# Patient Record
Sex: Male | Born: 2000 | Race: White | Hispanic: No | Marital: Single | Smoking: Never smoker
Health system: Southern US, Community
[De-identification: ages and names within clinical notes are randomized; demographics above are authoritative.]

## PROBLEM LIST (undated history)

## (undated) DIAGNOSIS — Z789 Other specified health status: Secondary | ICD-10-CM

## (undated) DIAGNOSIS — U071 COVID-19: Secondary | ICD-10-CM

## (undated) DIAGNOSIS — Z973 Presence of spectacles and contact lenses: Secondary | ICD-10-CM

---
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
## 2020-12-12 ENCOUNTER — Other Ambulatory Visit: Payer: Self-pay | Admitting: Urology

## 2020-12-15 ENCOUNTER — Encounter (HOSPITAL_BASED_OUTPATIENT_CLINIC_OR_DEPARTMENT_OTHER): Payer: Self-pay | Admitting: Urology

## 2020-12-15 ENCOUNTER — Other Ambulatory Visit: Payer: Self-pay

## 2020-12-15 NOTE — Progress Notes (Addendum)
Spoke w/ via phone for pre-op interview---pt Lab needs dos----    n/a           Lab results------n/a COVID test -----patient states asymptomatic no test needed Arrive at -------10am NPO after MN NO Solid Food.  Clear liquids from MN until-0900 on December 22, 2020-- Med rec completed Medications to take morning of surgery -----n/a Diabetic medication -----n/a Patient instructed no nail polish to be worn day of surgery Patient instructed to bring photo id and insurance card day of surgery Patient aware to have Driver (ride ) Steven Singh caregiver    for 24 hours after surgery  Patient Special Instructions -----n/a Pre-Op special Istructions -----n/a Patient verbalized understanding of instructions that were given at this phone interview. Patient denies shortness of breath, chest pain, fever, cough at this phone interview.

## 2020-12-21 NOTE — Anesthesia Preprocedure Evaluation (Addendum)
Anesthesia Evaluation  Patient identified by MRN, date of birth, ID band Patient awake    Reviewed: Allergy & Precautions, NPO status , Patient's Chart, lab work & pertinent test results  Airway Mallampati: II  TM Distance: >3 FB Neck ROM: Full   Comment: [place] no notable dental hx. (+) Teeth Intact, Dental Advisory Given   Pulmonary    Pulmonary exam normal breath sounds clear to auscultation       Cardiovascular Exercise Tolerance: Good Normal cardiovascular exam Rhythm:Regular Rate:Normal     Neuro/Psych negative neurological ROS  negative psych ROS   GI/Hepatic negative GI ROS, Neg liver ROS,   Endo/Other  negative endocrine ROS  Renal/GU    L testicular mass negative genitourinary   Musculoskeletal negative musculoskeletal ROS (+)   Abdominal   Peds  Hematology   Anesthesia Other Findings BMI 18.31  Reproductive/Obstetrics                            Anesthesia Physical Anesthesia Plan  ASA: 1  Anesthesia Plan: General   Post-op Pain Management:    Induction: Intravenous  PONV Risk Score and Plan: 3 and Treatment may vary due to age or medical condition, Midazolam, Ondansetron and Dexamethasone  Airway Management Planned: Oral ETT  Additional Equipment: None  Intra-op Plan:   Post-operative Plan: Extubation in OR  Informed Consent: I have reviewed the patients History and Physical, chart, labs and discussed the procedure including the risks, benefits and alternatives for the proposed anesthesia with the patient or authorized representative who has indicated his/her understanding and acceptance.     Dental advisory given  Plan Discussed with: CRNA and Anesthesiologist  Anesthesia Plan Comments: (GA w ETT)       Anesthesia Quick Evaluation

## 2020-12-22 ENCOUNTER — Ambulatory Visit (HOSPITAL_BASED_OUTPATIENT_CLINIC_OR_DEPARTMENT_OTHER): Payer: BC Managed Care – PPO | Admitting: Anesthesiology

## 2020-12-22 ENCOUNTER — Ambulatory Visit (HOSPITAL_BASED_OUTPATIENT_CLINIC_OR_DEPARTMENT_OTHER)
Admission: RE | Admit: 2020-12-22 | Discharge: 2020-12-22 | Disposition: A | Discharge status: Home / Self Care | Payer: BC Managed Care – PPO | Attending: Urology | Admitting: Urology

## 2020-12-22 ENCOUNTER — Encounter (HOSPITAL_BASED_OUTPATIENT_CLINIC_OR_DEPARTMENT_OTHER)
Admission: RE | Disposition: A | Discharge status: Home / Self Care | Payer: Self-pay | Source: Home / Self Care | Attending: Urology

## 2020-12-22 ENCOUNTER — Encounter (HOSPITAL_BASED_OUTPATIENT_CLINIC_OR_DEPARTMENT_OTHER): Payer: Self-pay | Admitting: Urology

## 2020-12-22 DIAGNOSIS — C6292 Malignant neoplasm of left testis, unspecified whether descended or undescended: Secondary | ICD-10-CM | POA: Insufficient documentation

## 2020-12-22 DIAGNOSIS — N509 Disorder of male genital organs, unspecified: Secondary | ICD-10-CM | POA: Diagnosis present

## 2020-12-22 HISTORY — DX: Presence of spectacles and contact lenses: Z97.3

## 2020-12-22 HISTORY — DX: Other specified health status: Z78.9

## 2020-12-22 HISTORY — PX: TESTICULAR PROSTHETIC INSERTION: SHX491

## 2020-12-22 HISTORY — DX: COVID-19: U07.1

## 2020-12-22 HISTORY — PX: ORCHIECTOMY: SHX2116

## 2020-12-22 SURGERY — ORCHIECTOMY
Anesthesia: General | Laterality: Left

## 2020-12-22 MED ORDER — PROPOFOL 10 MG/ML IV BOLUS
INTRAVENOUS | Status: DC | PRN
Start: 2020-12-22 — End: 2020-12-22
  Administered 2020-12-22: 170 mg via INTRAVENOUS

## 2020-12-22 MED ORDER — DEXAMETHASONE SODIUM PHOSPHATE 10 MG/ML IJ SOLN
INTRAMUSCULAR | Status: AC
  Filled 2020-12-22: qty 1

## 2020-12-22 MED ORDER — OXYCODONE HCL 5 MG/5ML PO SOLN: 5.0000 mg | Freq: Once | ORAL | Status: DC | PRN

## 2020-12-22 MED ORDER — LIDOCAINE 2% (20 MG/ML) 5 ML SYRINGE
INTRAMUSCULAR | Status: DC | PRN
  Administered 2020-12-22: 100 mg via INTRAVENOUS

## 2020-12-22 MED ORDER — SULFAMETHOXAZOLE-TRIMETHOPRIM 800-160 MG PO TABS: 1.0000 | ORAL_TABLET | Freq: Two times a day (BID) | ORAL | 0 refills | Status: DC

## 2020-12-22 MED ORDER — ROCURONIUM BROMIDE 10 MG/ML (PF) SYRINGE
PREFILLED_SYRINGE | INTRAVENOUS | Status: AC
  Filled 2020-12-22: qty 10

## 2020-12-22 MED ORDER — AMISULPRIDE (ANTIEMETIC) 5 MG/2ML IV SOLN: 10.0000 mg | Freq: Once | INTRAVENOUS | Status: DC | PRN

## 2020-12-22 MED ORDER — LACTATED RINGERS IV SOLN: INTRAVENOUS | Status: DC

## 2020-12-22 MED ORDER — ONDANSETRON HCL 4 MG/2ML IJ SOLN
INTRAMUSCULAR | Status: DC | PRN
  Administered 2020-12-22: 4 mg via INTRAVENOUS

## 2020-12-22 MED ORDER — ONDANSETRON HCL 4 MG/2ML IJ SOLN: 4.0000 mg | Freq: Once | INTRAMUSCULAR | Status: DC | PRN

## 2020-12-22 MED ORDER — HYDROCODONE-ACETAMINOPHEN 5-325 MG PO TABS: 1.0000 | ORAL_TABLET | ORAL | 0 refills | Status: DC | PRN

## 2020-12-22 MED ORDER — MIDAZOLAM HCL 5 MG/5ML IJ SOLN
INTRAMUSCULAR | Status: DC | PRN
  Administered 2020-12-22: 2 mg via INTRAVENOUS

## 2020-12-22 MED ORDER — PHENYLEPHRINE 40 MCG/ML (10ML) SYRINGE FOR IV PUSH (FOR BLOOD PRESSURE SUPPORT)
PREFILLED_SYRINGE | INTRAVENOUS | Status: DC | PRN
  Administered 2020-12-22 (×4): 80 ug via INTRAVENOUS

## 2020-12-22 MED ORDER — FENTANYL CITRATE (PF) 250 MCG/5ML IJ SOLN
INTRAMUSCULAR | Status: AC
  Filled 2020-12-22: qty 5

## 2020-12-22 MED ORDER — SUGAMMADEX SODIUM 200 MG/2ML IV SOLN
INTRAVENOUS | Status: DC | PRN
  Administered 2020-12-22: 200 mg via INTRAVENOUS

## 2020-12-22 MED ORDER — ACETAMINOPHEN 10 MG/ML IV SOLN: 1000.0000 mg | Freq: Once | INTRAVENOUS | Status: DC | PRN

## 2020-12-22 MED ORDER — PHENYLEPHRINE 40 MCG/ML (10ML) SYRINGE FOR IV PUSH (FOR BLOOD PRESSURE SUPPORT)
PREFILLED_SYRINGE | INTRAVENOUS | Status: AC
  Filled 2020-12-22: qty 10

## 2020-12-22 MED ORDER — FENTANYL CITRATE (PF) 100 MCG/2ML IJ SOLN
INTRAMUSCULAR | Status: DC | PRN
  Administered 2020-12-22: 125 ug via INTRAVENOUS

## 2020-12-22 MED ORDER — HYDROMORPHONE HCL 1 MG/ML IJ SOLN: 0.2500 mg | INTRAMUSCULAR | Status: DC | PRN

## 2020-12-22 MED ORDER — DEXAMETHASONE SODIUM PHOSPHATE 10 MG/ML IJ SOLN
INTRAMUSCULAR | Status: DC | PRN
  Administered 2020-12-22: 10 mg via INTRAVENOUS

## 2020-12-22 MED ORDER — SODIUM CHLORIDE 0.9 % IV SOLN
INTRAVENOUS | Status: AC
  Filled 2020-12-22: qty 2

## 2020-12-22 MED ORDER — PROPOFOL 10 MG/ML IV BOLUS
INTRAVENOUS | Status: AC
  Filled 2020-12-22: qty 20

## 2020-12-22 MED ORDER — SODIUM CHLORIDE 0.9% FLUSH: 3.0000 mL | Freq: Two times a day (BID) | INTRAVENOUS | Status: DC

## 2020-12-22 MED ORDER — SODIUM CHLORIDE 0.9 % IV SOLN
2.0000 g | INTRAVENOUS | Status: AC
  Administered 2020-12-22: 2 g via INTRAVENOUS

## 2020-12-22 MED ORDER — OXYCODONE HCL 5 MG PO TABS
5.0000 mg | ORAL_TABLET | Freq: Once | ORAL | Status: DC | PRN
Start: 2020-12-22 — End: 2020-12-22

## 2020-12-22 MED ORDER — ROCURONIUM BROMIDE 10 MG/ML (PF) SYRINGE
PREFILLED_SYRINGE | INTRAVENOUS | Status: DC | PRN
  Administered 2020-12-22: 40 mg via INTRAVENOUS

## 2020-12-22 MED ORDER — ONDANSETRON HCL 4 MG/2ML IJ SOLN
INTRAMUSCULAR | Status: AC
  Filled 2020-12-22: qty 2

## 2020-12-22 MED ORDER — MIDAZOLAM HCL 2 MG/2ML IJ SOLN
INTRAMUSCULAR | Status: AC
  Filled 2020-12-22: qty 2

## 2020-12-22 SURGICAL SUPPLY — 41 items
ADH SKN CLS APL DERMABOND .7 (GAUZE/BANDAGES/DRESSINGS) ×1
BLADE CLIPPER SENSICLIP SURGIC (BLADE) ×2 IMPLANT
BLADE SURG 15 STRL LF DISP TIS (BLADE) ×1 IMPLANT
BLADE SURG 15 STRL SS (BLADE) ×2
BNDG GAUZE ELAST 4 BULKY (GAUZE/BANDAGES/DRESSINGS) ×2 IMPLANT
CLOTH BEACON ORANGE TIMEOUT ST (SAFETY) ×2 IMPLANT
COVER BACK TABLE 60X90IN (DRAPES) ×2 IMPLANT
COVER MAYO STAND STRL (DRAPES) ×2 IMPLANT
DERMABOND ADVANCED (GAUZE/BANDAGES/DRESSINGS) ×1
DERMABOND ADVANCED .7 DNX12 (GAUZE/BANDAGES/DRESSINGS) ×1 IMPLANT
DRAIN PENROSE 0.25X18 (DRAIN) ×2 IMPLANT
DRAPE LAPAROTOMY 100X72 PEDS (DRAPES) ×2 IMPLANT
DRSG TEGADERM 4X4.75 (GAUZE/BANDAGES/DRESSINGS) ×2 IMPLANT
ELECT NEEDLE TIP 2.8 STRL (NEEDLE) IMPLANT
ELECT REM PT RETURN 9FT ADLT (ELECTROSURGICAL) ×2
ELECTRODE REM PT RTRN 9FT ADLT (ELECTROSURGICAL) ×1 IMPLANT
GAUZE 4X4 16PLY ~~LOC~~+RFID DBL (SPONGE) ×2 IMPLANT
GAUZE SPONGE 4X4 12PLY STRL (GAUZE/BANDAGES/DRESSINGS) ×2 IMPLANT
GLOVE SURG POLYISO LF SZ8 (GLOVE) ×2 IMPLANT
GOWN STRL REUS W/TWL LRG LVL3 (GOWN DISPOSABLE) ×4 IMPLANT
GOWN STRL REUS W/TWL XL LVL3 (GOWN DISPOSABLE) ×2 IMPLANT
KIT TURNOVER CYSTO (KITS) ×2 IMPLANT
NEEDLE HYPO 22GX1.5 SAFETY (NEEDLE) ×2 IMPLANT
PACK BASIN DAY SURGERY FS (CUSTOM PROCEDURE TRAY) ×2 IMPLANT
PENCIL SMOKE EVACUATOR (MISCELLANEOUS) ×2 IMPLANT
PROSTHESIS TESTICULAR NAC LRG (Urological Implant) ×1 IMPLANT
SET COLLECT BLD 25X3/4 12 (NEEDLE) ×2 IMPLANT
SUPPORT SCROTAL LG STRP (MISCELLANEOUS) ×2 IMPLANT
SUT CHROMIC 3 0 SH 27 (SUTURE) ×2 IMPLANT
SUT MNCRL AB 4-0 PS2 18 (SUTURE) ×2 IMPLANT
SUT PROLENE 3 0 PS 2 (SUTURE) ×2 IMPLANT
SUT VIC AB 3-0 SH 27 (SUTURE) ×2
SUT VIC AB 3-0 SH 27X BRD (SUTURE) ×1 IMPLANT
SYR 20ML LL LF (SYRINGE) ×2 IMPLANT
SYR BULB IRRIG 60ML STRL (SYRINGE) ×2 IMPLANT
SYR CONTROL 10ML LL (SYRINGE) ×2 IMPLANT
TESTICULAR PROSTHESIS NAC LRG (Urological Implant) ×2 IMPLANT
TOWEL OR 17X26 10 PK STRL BLUE (TOWEL DISPOSABLE) ×2 IMPLANT
TRAY DSU PREP LF (CUSTOM PROCEDURE TRAY) ×2 IMPLANT
TUBE CONNECTING 12X1/4 (SUCTIONS) ×2 IMPLANT
YANKAUER SUCT BULB TIP NO VENT (SUCTIONS) ×2 IMPLANT

## 2020-12-22 NOTE — Interval H&P Note (Signed)
History and Physical Interval Note:  He has elected to have a testicular prosthesis placed and this was added to his consent.   His AFP and BHCG were minimally elevated.   12/22/2020 11:51 AM  Steven Singh  has presented today for surgery, with the diagnosis of LEFT TESTICULAR MASS.  The various methods of treatment have been discussed with the patient and family. After consideration of risks, benefits and other options for treatment, the patient has consented to  Procedure(s): ORCHIECTOMY (Left) POSSIBLE INSERTION TESTICULAR PROSTHESIS (Left) as a surgical intervention.  The patient's history has been reviewed, patient examined, no change in status, stable for surgery.  I have reviewed the patient's chart and labs.  Questions were answered to the patient's satisfaction.     Irine Seal

## 2020-12-22 NOTE — Transfer of Care (Signed)
Immediate Anesthesia Transfer of Care Note  Patient: Steven Singh  Procedure(s) Performed: ORCHIECTOMY (Left) INSERTION LEFT TESTICULAR PROSTHESIS (Left)  Patient Location: PACU  Anesthesia Type:General  Level of Consciousness: drowsy, patient cooperative and responds to stimulation  Airway & Oxygen Therapy: Patient Spontanous Breathing and Patient connected to face mask oxygen  Post-op Assessment: Report given to RN and Post -op Vital signs reviewed and stable  Post vital signs: Reviewed and stable  Last Vitals:  Vitals Value Taken Time  BP 108/61 12/22/20 1304  Temp    Pulse 80 12/22/20 1307  Resp 15 12/22/20 1307  SpO2 100 % 12/22/20 1307  Vitals shown include unvalidated device data.  Last Pain:  Vitals:   12/22/20 1034  TempSrc: Oral      Patients Stated Pain Goal: 5 ([id number])  Complications: No notable events documented.

## 2020-12-22 NOTE — Anesthesia Postprocedure Evaluation (Signed)
Anesthesia Post Note  Patient: Steven Singh  Procedure(s) Performed: ORCHIECTOMY (Left) INSERTION LEFT TESTICULAR PROSTHESIS (Left)     Patient location during evaluation: PACU Anesthesia Type: General Level of consciousness: awake and alert Pain management: pain level controlled Vital Signs Assessment: post-procedure vital signs reviewed and stable Respiratory status: spontaneous breathing, nonlabored ventilation, respiratory function stable and patient connected to nasal cannula oxygen Cardiovascular status: blood pressure returned to baseline and stable Postop Assessment: no apparent nausea or vomiting Anesthetic complications: no   No notable events documented.  Last Vitals:  Vitals:   12/22/20 1355 12/22/20 1437  BP:  114/63  Pulse: 73 (!) 53  Resp: 13 14  Temp: 36.6 C 36.7 C  SpO2: 100% 100%    Last Pain:  Vitals:   12/22/20 1437  TempSrc:   PainSc: 3                  Barnet Glasgow

## 2020-12-22 NOTE — Anesthesia Procedure Notes (Signed)
Procedure Name: Intubation Date/Time: 12/22/2020 12:00 PM Performed by: Rogers Blocker, CRNA Pre-anesthesia Checklist: Patient identified, Emergency Drugs available, Suction available and Patient being monitored Patient Re-evaluated:Patient Re-evaluated prior to induction Oxygen Delivery Method: Circle System Utilized Preoxygenation: Pre-oxygenation with 100% oxygen Induction Type: IV induction Ventilation: Mask ventilation without difficulty Laryngoscope Size: Mac and 4 Grade View: Grade I Tube type: Oral Tube size: 7.0 mm Number of attempts: 1 Airway Equipment and Method: Stylet Placement Confirmation: ETT inserted through vocal cords under direct vision, positive ETCO2 and breath sounds checked- equal and bilateral Secured at: 22 cm Tube secured with: Tape Dental Injury: Teeth and Oropharynx as per pre-operative assessment

## 2020-12-22 NOTE — H&P (Signed)
CC: Testicular mass.   Hx: Steven Singh is a 20 yo male who presents for left testicular enlargement over the last month. He had a testicular injury at age 47 and he had delayed growth. He has minimal discomfort but the lesion is hard. He has no other associated signs or symptoms.     ALLERGIES: None   MEDICATIONS: None   GU PSH: No GU PSH      PSH Notes: Wisdom teeth extraction- 2014   NON-GU PSH: No Non-GU PSH    GU PMH: None   NON-GU PMH: None   FAMILY HISTORY: Diabetes - Runs in Family Heart problem - Grandfather Kidney Failure - Runs in Family Kidney Stones - Grandfather Thyroid Cancer - Grandmother   SOCIAL HISTORY: Marital Status: Single Preferred Language: English; Ethnicity: Not Hispanic Or Latino; Race: White Current Smoking Status: Patient has never smoked.   Tobacco Use Assessment Completed: Used Tobacco in last 30 days? Does not use smokeless tobacco. Drinks 1 drink per day.  Does not use drugs. Drinks 2 caffeinated drinks per day. Patient's occupation Physiological scientist.[employer]    REVIEW OF SYSTEMS:    GU Review Male:   Patient denies frequent urination, hard to postpone urination, burning/ pain with urination, get up at night to urinate, leakage of urine, stream starts and stops, trouble starting your stream, have to strain to urinate , erection problems, and penile pain.  Gastrointestinal (Upper):   Patient denies nausea, vomiting, and indigestion/ heartburn.  Gastrointestinal (Lower):   Patient denies diarrhea and constipation.  Constitutional:   Patient denies fever, night sweats, weight loss, and fatigue.  Skin:   Patient denies skin rash/ lesion and itching.  Eyes:   Patient denies blurred vision and double vision.  Ears/ Nose/ Throat:   Patient denies sore throat and sinus problems.  Hematologic/Lymphatic:   Patient denies easy bruising and swollen glands.  Cardiovascular:   Patient denies leg swelling and chest pains.  Respiratory:   Patient denies  cough and shortness of breath.  Endocrine:   Patient denies excessive thirst.  Musculoskeletal:   Patient denies back pain and joint pain.  Neurological:   Patient denies headaches and dizziness.  Psychologic:   Patient denies depression and anxiety.   VITAL SIGNS:      12/10/2020 10:49 AM  Weight 140 lb / 63.5 kg  Height 72 in / 182.88 cm  BP 120/71 mmHg  Pulse 93 /min  Temperature 97.7 F / 36.5 C  BMI 19.0 kg/m   GU PHYSICAL EXAMINATION:    Scrotum: No lesions. No edema. No cysts. No warts.  Epididymides: Right: no spermatocele, no masses, no cysts, no tenderness, no induration, no enlargement. Left: no spermatocele, no masses, no cysts, no tenderness, no induration, no enlargement.  Testes: Solid mass left testis about 3cm. No tenderness, no swelling, no enlargement left testis. No tenderness, no swelling, no enlargement right testis. Normal location left testis. Normal location right testis. No cyst, no varicocele, no hydrocele left testis. No mass, no cyst, no varicocele, no hydrocele right testis.   Urethral Meatus: Normal size. No lesion, no wart, no discharge, no polyp. Normal location.  Penis: Circumcised, no warts, no cracks. No dorsal Peyronie's plaques, no left corporal Peyronie's plaques, no right corporal Peyronie's plaques, no scarring, no warts. No balanitis, no meatal stenosis.   MULTI-SYSTEM PHYSICAL EXAMINATION:    Constitutional: Well-nourished. No physical deformities. Normally developed. Good grooming.  Neck: Neck symmetrical, not swollen. Normal tracheal position.  Respiratory: Normal breath sounds. No  labored breathing, no use of accessory muscles.   Cardiovascular: Regular rate and rhythm. No murmur, no gallop.   Lymphatic: No enlargement of neck, axillae, groin.  Skin: No paleness, no jaundice, no cyanosis. No lesion, no ulcer, no rash.  Neurologic / Psychiatric: Oriented to time, oriented to place, oriented to person. No depression, no anxiety, no agitation.   Gastrointestinal: No hernia. No mass, no tenderness, no rigidity, non obese abdomen.   Musculoskeletal: Normal gait and station of head and neck.     Complexity of Data:  Urine Test Review:   Urinalysis  X-Ray Review: Scrotal Ultrasound: Reviewed Films. Discussed With Patient.     PROCEDURES:         Scrotal Ultrasound - [id number]  Right Testicle: Length:4.7 cm  Height: 2.0 cm  Width: 2.7 cm  Left Testicle: Length: 4.6 cm  Height:2.6 cm  Width: 3.6 cm  Scrotal Wall:  WNL  Left Testis/Epididymis:  There is a large complex solid appearing mass in the mid Lt testicle. This measures 2.9x2.5x3.1 cm. This mass contains both complex cystic and solid components. There is internal vascularity noted. 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  Right Testis/Epididymis:  Appears wnl      . Patient confirmed No Neulasta OnPro Device.           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 I will get markers and get him set up for a left radical orchiectomy. I reviewed the risks of bleeding, infection, wound complications, nerve injury, pain, need for secondary therapy, endocrine and fertility issues, thrombotic events and anesthetic complications. I discussed a testicular prosthesis but he is not interested at this time.    PLAN:           Orders Labs Beta HCG, LDH, Alpha Fetoprotein (AFP)  X-Rays: Scrotal Ultrasound          Schedule Return Visit/Planned Activity: ASAP - Schedule Surgery  Procedure: Unspecified Date - Radical Orchiectomy  - [id number], left Notes: asap

## 2020-12-22 NOTE — Op Note (Signed)
Procedure: Left radical orchiectomy and placement of testicular prosthesis.  Preop diagnosis left testicular mass.  Postoperative diagnosis: Same.  Surgeon: Dr. Irine Seal.  Anesthesia: General.  Specimen: Left testicle and cord.  Drains: None.  Prosthesis: Large left testicular prosthesis.  EBL: 3 mL.  Complications: None.  Indications: Steven Singh is a 20 year old male who presented with a 1 month history of a left testicular mass which on physical exam and ultrasound was consistent with a testicular neoplasm.  Preoperative tumor markers demonstrate very mild elevation of alpha-fetoprotein and beta-hCG.  He is to undergo a left radical orchiectomy and placement of the testicular prosthesis.  Procedure: He was taken operating room where general anesthetic was induced.  PAS hose were placed.  He was given 2 g of Ancef.  He was left supine and his genitalia was clipped.  He was then prepped with a 5-minute Betadine scrub followed by Betadine solution and then draped in the usual sterile fashion.  A left inguinal oblique skin incision was made along the skin lines approximately 4 to 5 cm in length with a knife.  The incision was then opened further using the Bovie down through Scarpa's fascia.  The external bleak fascia was exposed and then incised along the length of its fibers down to the external ring.  Ilioinguinal nerve was identified and protected.  The left spermatic cord was then dissected out and once freed up sufficiently encircled with 1/4 inch Penrose drain and an occlusive fashion.  The testicle was then delivered into the wound and the gubernacular attachments were taken down.  Prior to final detachment a 3-0 Prolene suture was placed to aid placement of the prosthesis.  The cord was then dissected back to the internal ring and divided into 2 packets.  Specimen was removed and the vascular pedicles were doubly ligated with 0 Vicryl ties which were clipped long for future  identification if needed.  A large testicular prosthesis was then prepared.  It was filled with approximately 60 mL of sterile saline and the preplaced suture was then placed through the anchoring loop and tied down.  Once position was confirmed to be satisfactory the suture was trimmed.  The wound was then irrigated with saline and final hemostasis was achieved.  The external oblique fascia was then closed with a running 3-0 Vicryl with great care being taken to avoid entrapment of the ileal inguinal nerve.  The subcutaneous tissue was then reapproximated with 3-0 chromic sutures and the skin with a running intracuticular 4-0 Monocryl.  The wound was then reinforced with Dermabond.  The testicular prosthesis was then pulled down to ensure proper placement and then fluffed Curlex and a scrotal support was placed.  His anesthetic was reversed and he was moved recovery room in stable condition.  There were no complications.  The specimen was sent to pathology.

## 2020-12-22 NOTE — Discharge Instructions (Addendum)

## 2020-12-23 ENCOUNTER — Encounter (HOSPITAL_BASED_OUTPATIENT_CLINIC_OR_DEPARTMENT_OTHER): Payer: Self-pay | Admitting: Urology

## 2020-12-25 LAB — SURGICAL PATHOLOGY

## 2020-12-29 ENCOUNTER — Ambulatory Visit (HOSPITAL_COMMUNITY)
Admission: RE | Admit: 2020-12-29 | Discharge: 2020-12-29 | Disposition: A | Discharge status: Home / Self Care | Payer: BC Managed Care – PPO | Source: Ambulatory Visit | Attending: Urology | Admitting: Urology

## 2020-12-29 ENCOUNTER — Other Ambulatory Visit (HOSPITAL_COMMUNITY): Payer: Self-pay | Admitting: Urology

## 2020-12-29 ENCOUNTER — Telehealth: Payer: Self-pay | Admitting: Oncology

## 2020-12-29 ENCOUNTER — Other Ambulatory Visit: Payer: Self-pay

## 2020-12-29 DIAGNOSIS — C6292 Malignant neoplasm of left testis, unspecified whether descended or undescended: Secondary | ICD-10-CM

## 2020-12-29 NOTE — Telephone Encounter (Signed)
Received a new patient referral from Dr. Jeffie Pollock for T1 mixed germ cell tumor. Mr. Steven Singh has been cld and scheduled to see Dr. Alen Blew on 8/17 at 11am. Pt aware to arrive 20 minutes early.

## 2021-01-14 ENCOUNTER — Inpatient Hospital Stay: Payer: BC Managed Care – PPO | Attending: Oncology | Admitting: Oncology

## 2021-01-14 ENCOUNTER — Other Ambulatory Visit: Payer: Self-pay

## 2021-01-14 VITALS — BP 123/61 | HR 87 | Temp 98.3°F | Resp 18 | Ht 72.0 in | Wt 143.8 lb

## 2021-01-14 DIAGNOSIS — Z8616 Personal history of COVID-19: Secondary | ICD-10-CM

## 2021-01-14 DIAGNOSIS — Z87891 Personal history of nicotine dependence: Secondary | ICD-10-CM | POA: Diagnosis not present

## 2021-01-14 DIAGNOSIS — C6292 Malignant neoplasm of left testis, unspecified whether descended or undescended: Secondary | ICD-10-CM

## 2021-01-14 DIAGNOSIS — C6291 Malignant neoplasm of right testis, unspecified whether descended or undescended: Secondary | ICD-10-CM | POA: Diagnosis not present

## 2021-01-14 DIAGNOSIS — C6290 Malignant neoplasm of unspecified testis, unspecified whether descended or undescended: Secondary | ICD-10-CM | POA: Insufficient documentation

## 2021-01-14 NOTE — Progress Notes (Signed)
Reason for the request:    Testicular cancer  HPI: I was asked by Dr. Jeffie Singh to evaluate Steven Singh for the evaluation of testicular cancer.  This is a 20 year old rather healthy man presented with left testicular mass in July 2022.  He was evaluated by Dr. Jeffie Singh and testicular ultrasound showed the large complex mass of the left testicle measuring 2.9 x 2.5 x 3.1 cm.  Tumor markers obtained at that time and showed an elevated alpha-fetoprotein of 19.4, beta-hCG of and LDH.  and normal LDH.  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  His embryonal component is 45% with yolk sac of 10%, teratoma 40% and seminoma 5%.  Staging scans including CT scan abdomen and pelvis and chest x-ray did not show any evidence of metastatic disease.  Repeat tumor markers on January 03, 2021 showed normal alpha-fetoprotein beta-hCG and LDH.  Clinically, he reports no complaints at this time.  He has recovered from surgery well without any issues.  He does not report any headaches, blurry vision, syncope or seizures. Does not report any fevers, chills or sweats.  Does not report any cough, wheezing or hemoptysis.  Does not report any chest pain, palpitation, orthopnea or leg edema.  Does not report any nausea, vomiting or abdominal pain.  Does not report any constipation or diarrhea.  Does not report any skeletal complaints.    Does not report frequency, urgency or hematuria.  Does not report any skin rashes or lesions. Does not report any heat or cold intolerance.  Does not report any lymphadenopathy or petechiae.  Does not report any anxiety or depression.  Remaining review of systems is negative.     Past Medical History:  Diagnosis Date   COVID    had in JULY 2020, runny nose for@ a week. lost taste and smell for @ 1 month   Medical history non-contributory    Wears glasses    wears glasses and contacts. December 15, 2020  :   Past Surgical History:   Procedure Laterality Date   ORCHIECTOMY Left 12/22/2020   Procedure: ORCHIECTOMY;  Surgeon: Irine Seal, MD;  Location: [facility];  Service: Urology;  Laterality: Left;   TESTICULAR PROSTHETIC INSERTION Left 12/22/2020   Procedure: INSERTION LEFT TESTICULAR PROSTHESIS;  Surgeon: Irine Seal, MD;  Location: [facility];  Service: Urology;  Laterality: Left;   WISDOM TOOTH EXTRACTION     removed in 2019 july 18, 22  :   Current Outpatient Medications:    HYDROcodone-acetaminophen (NORCO/VICODIN) 5-325 MG tablet, Take 1 tablet by mouth every 4 (four) hours as needed for moderate pain., Disp: 8 tablet, Rfl: 0   sulfamethoxazole-trimethoprim (BACTRIM DS) 800-160 MG tablet, Take 1 tablet by mouth 2 (two) times daily., Disp: 6 tablet, Rfl: 0:   Allergies  Allergen Reactions   Other     Bananas mild itch in back of throat  :  No family history on file.:   Social History   Socioeconomic History   Marital status: Single    Spouse name: Not on file   Number of children: Not on file   Years of education: Not on file   Highest education level: Not on file  Occupational History   Not on file  Tobacco Use   Smoking status: Never   Smokeless tobacco: Never  Vaping Use   Vaping Use: Former   Start date: 06/01/2015   Quit  date: 09/17/2020  Substance and Sexual Activity   Alcohol use: Yes    Alcohol/week: 3.0 standard drinks    Types: 3 Cans of beer per week   Drug use: Never   Sexual activity: Not on file  Other Topics Concern   Not on file  Social History Narrative   Not on file   Social Determinants of Health   Financial Resource Strain: Not on file  Food Insecurity: Not on file  Transportation Needs: Not on file  Physical Activity: Not on file  Stress: Not on file  Social Connections: Not on file  Intimate Partner Violence: Not on file  :  Pertinent items are noted in HPI.  Exam: Blood pressure 123/61, pulse 87, temperature 98.3 F (36.8  C), temperature source Oral, resp. rate 18, height 6' (1.829 m), weight 143 lb 12.8 oz (65.2 kg), SpO2 100 %. ECOG 0 General appearance: alert and cooperative appeared without distress. Head: atraumatic without any abnormalities. Eyes: conjunctivae/corneas clear. PERRL.  Sclera anicteric. Throat: lips, mucosa, and tongue normal; without oral thrush or ulcers. Resp: clear to auscultation bilaterally without rhonchi, wheezes or dullness to percussion. Cardio: regular rate and rhythm, S1, S2 normal, no murmur, click, rub or gallop GI: soft, non-tender; bowel sounds normal; no masses,  no organomegaly Skin: Skin color, texture, turgor normal. No rashes or lesions Lymph nodes: Cervical, supraclavicular, and axillary nodes normal. Neurologic: Grossly normal without any motor, sensory or deep tendon reflexes. Musculoskeletal: No joint deformity or effusion.   DG Chest 2 View  Result Date: 12/29/2020 CLINICAL DATA:  Testicular neoplasm. EXAM: CHEST - 2 VIEW COMPARISON:  None. FINDINGS: The heart size and mediastinal contours are within normal limits. There is no evidence of pulmonary edema, consolidation, pneumothorax, nodule or pleural fluid. The visualized skeletal structures are unremarkable. IMPRESSION: No active cardiopulmonary disease.  No visualized pulmonary nodules. Electronically Signed   By: Aletta Edouard M.D.   On: 12/29/2020 16:08    Assessment and Plan:    20 year old with:  1.  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  He presented with left testicular mass and found to have 45% embryonal component with 10% yolk sac, 40% teratoma 5% seminoma.   The natural course of this disease was reviewed at this time and treatment choices were discussed.  Despite the 45% embryonal component, he still has low risk stage I disease.  I estimated the risk of relapse at this time after orchiectomy of at most of 20%.  He understands the majority of patients are cured  with orchiectomy along and this disease is treatable with survival rate close to 100% with salvage therapy if he relapses.  Treatment options however were discussed including active surveillance versus 1-2 cycles of BEP chemotherapy versus RPLND surgery.  The risks and benefits of all these approaches were discussed at this time. Given his low risk disease I have recommended active surveillance which he is in favor of.  Complication associated with chemotherapy include nausea, vomiting, myelosuppression and fertility issues.  Complication associated with surgery and include retrograde ejaculation as well as postoperative thrombosis, infection among others were reviewed.  Infertility issues was also emphasized after surgery.   He appears reliable and willing to continue with active surveillance.  We have discussed different strategies and protocols for active surveillance.  Physical examination, tumor markers and chest x-ray every 3 months with imaging studies of the abdomen and pelvis every 6 months for the first 2 years is recommended.  He is  agreeable to proceed with this approach.  He is scheduled for follow-up and surveillance under the care of Dr. Jeffie Singh which she prefers to do.  I am happy to address this further in the future or if he develops any relapse.  2.  Follow-up: I am happy to see him in the future as needed.   60  minutes were dedicated to this visit. The time was spent on reviewing laboratory data, imaging studies, discussing treatment options, di and answering questions regarding future plan.    A copy of this consult has been forwarded to the requesting physician.

## 2021-03-12 ENCOUNTER — Other Ambulatory Visit: Payer: Self-pay

## 2021-03-12 ENCOUNTER — Ambulatory Visit (HOSPITAL_COMMUNITY)
Admission: RE | Admit: 2021-03-12 | Discharge: 2021-03-12 | Disposition: A | Discharge status: Home / Self Care | Payer: BC Managed Care – PPO | Source: Ambulatory Visit | Attending: Urology | Admitting: Urology

## 2021-03-12 ENCOUNTER — Other Ambulatory Visit (HOSPITAL_COMMUNITY): Payer: Self-pay | Admitting: Urology

## 2021-03-12 DIAGNOSIS — C6290 Malignant neoplasm of unspecified testis, unspecified whether descended or undescended: Secondary | ICD-10-CM

## 2021-07-06 ENCOUNTER — Other Ambulatory Visit (HOSPITAL_COMMUNITY): Payer: Self-pay | Admitting: Urology

## 2021-07-06 ENCOUNTER — Other Ambulatory Visit: Payer: Self-pay

## 2021-07-06 ENCOUNTER — Ambulatory Visit (HOSPITAL_COMMUNITY)
Admission: RE | Admit: 2021-07-06 | Discharge: 2021-07-06 | Disposition: A | Discharge status: Home / Self Care | Payer: BC Managed Care – PPO | Source: Ambulatory Visit | Attending: Urology | Admitting: Urology

## 2021-07-06 DIAGNOSIS — Z8547 Personal history of malignant neoplasm of testis: Secondary | ICD-10-CM

## 2021-10-06 ENCOUNTER — Other Ambulatory Visit (HOSPITAL_COMMUNITY): Payer: Self-pay | Admitting: Urology

## 2021-10-06 ENCOUNTER — Ambulatory Visit (HOSPITAL_COMMUNITY)
Admission: RE | Admit: 2021-10-06 | Discharge: 2021-10-06 | Disposition: A | Discharge status: Home / Self Care | Payer: BC Managed Care – PPO | Source: Ambulatory Visit | Attending: Urology | Admitting: Urology

## 2021-10-06 DIAGNOSIS — Z8547 Personal history of malignant neoplasm of testis: Secondary | ICD-10-CM | POA: Insufficient documentation

## 2021-12-28 ENCOUNTER — Other Ambulatory Visit (HOSPITAL_COMMUNITY): Payer: Self-pay | Admitting: Urology

## 2021-12-28 ENCOUNTER — Ambulatory Visit (HOSPITAL_COMMUNITY)
Admission: RE | Admit: 2021-12-28 | Discharge: 2021-12-28 | Disposition: A | Discharge status: Home / Self Care | Payer: BC Managed Care – PPO | Source: Ambulatory Visit | Attending: Urology | Admitting: Urology

## 2021-12-28 DIAGNOSIS — Z8547 Personal history of malignant neoplasm of testis: Secondary | ICD-10-CM | POA: Diagnosis present

## 2022-04-26 ENCOUNTER — Other Ambulatory Visit (HOSPITAL_COMMUNITY): Payer: Self-pay | Admitting: Urology

## 2022-04-26 ENCOUNTER — Ambulatory Visit (HOSPITAL_COMMUNITY)
Admission: RE | Admit: 2022-04-26 | Discharge: 2022-04-26 | Disposition: A | Discharge status: Home / Self Care | Payer: BC Managed Care – PPO | Source: Ambulatory Visit | Attending: Urology | Admitting: Urology

## 2022-04-26 DIAGNOSIS — Z8547 Personal history of malignant neoplasm of testis: Secondary | ICD-10-CM

## 2022-05-03 DIAGNOSIS — Z8547 Personal history of malignant neoplasm of testis: Secondary | ICD-10-CM | POA: Diagnosis not present

## 2022-07-08 ENCOUNTER — Telehealth: Payer: Self-pay

## 2022-07-08 NOTE — Telephone Encounter (Signed)
Mychart msg sent. AS, CMA 

## 2022-07-26 IMAGING — DX DG CHEST 2V
2 series · 2 of 2 positions shown · non-contrast
Comparison: Chest x-ray 12/29/2020.

CLINICAL DATA: 19-year-old male with history of testicular
neoplasm.

EXAM:
CHEST - 2 VIEW

[chest pa]
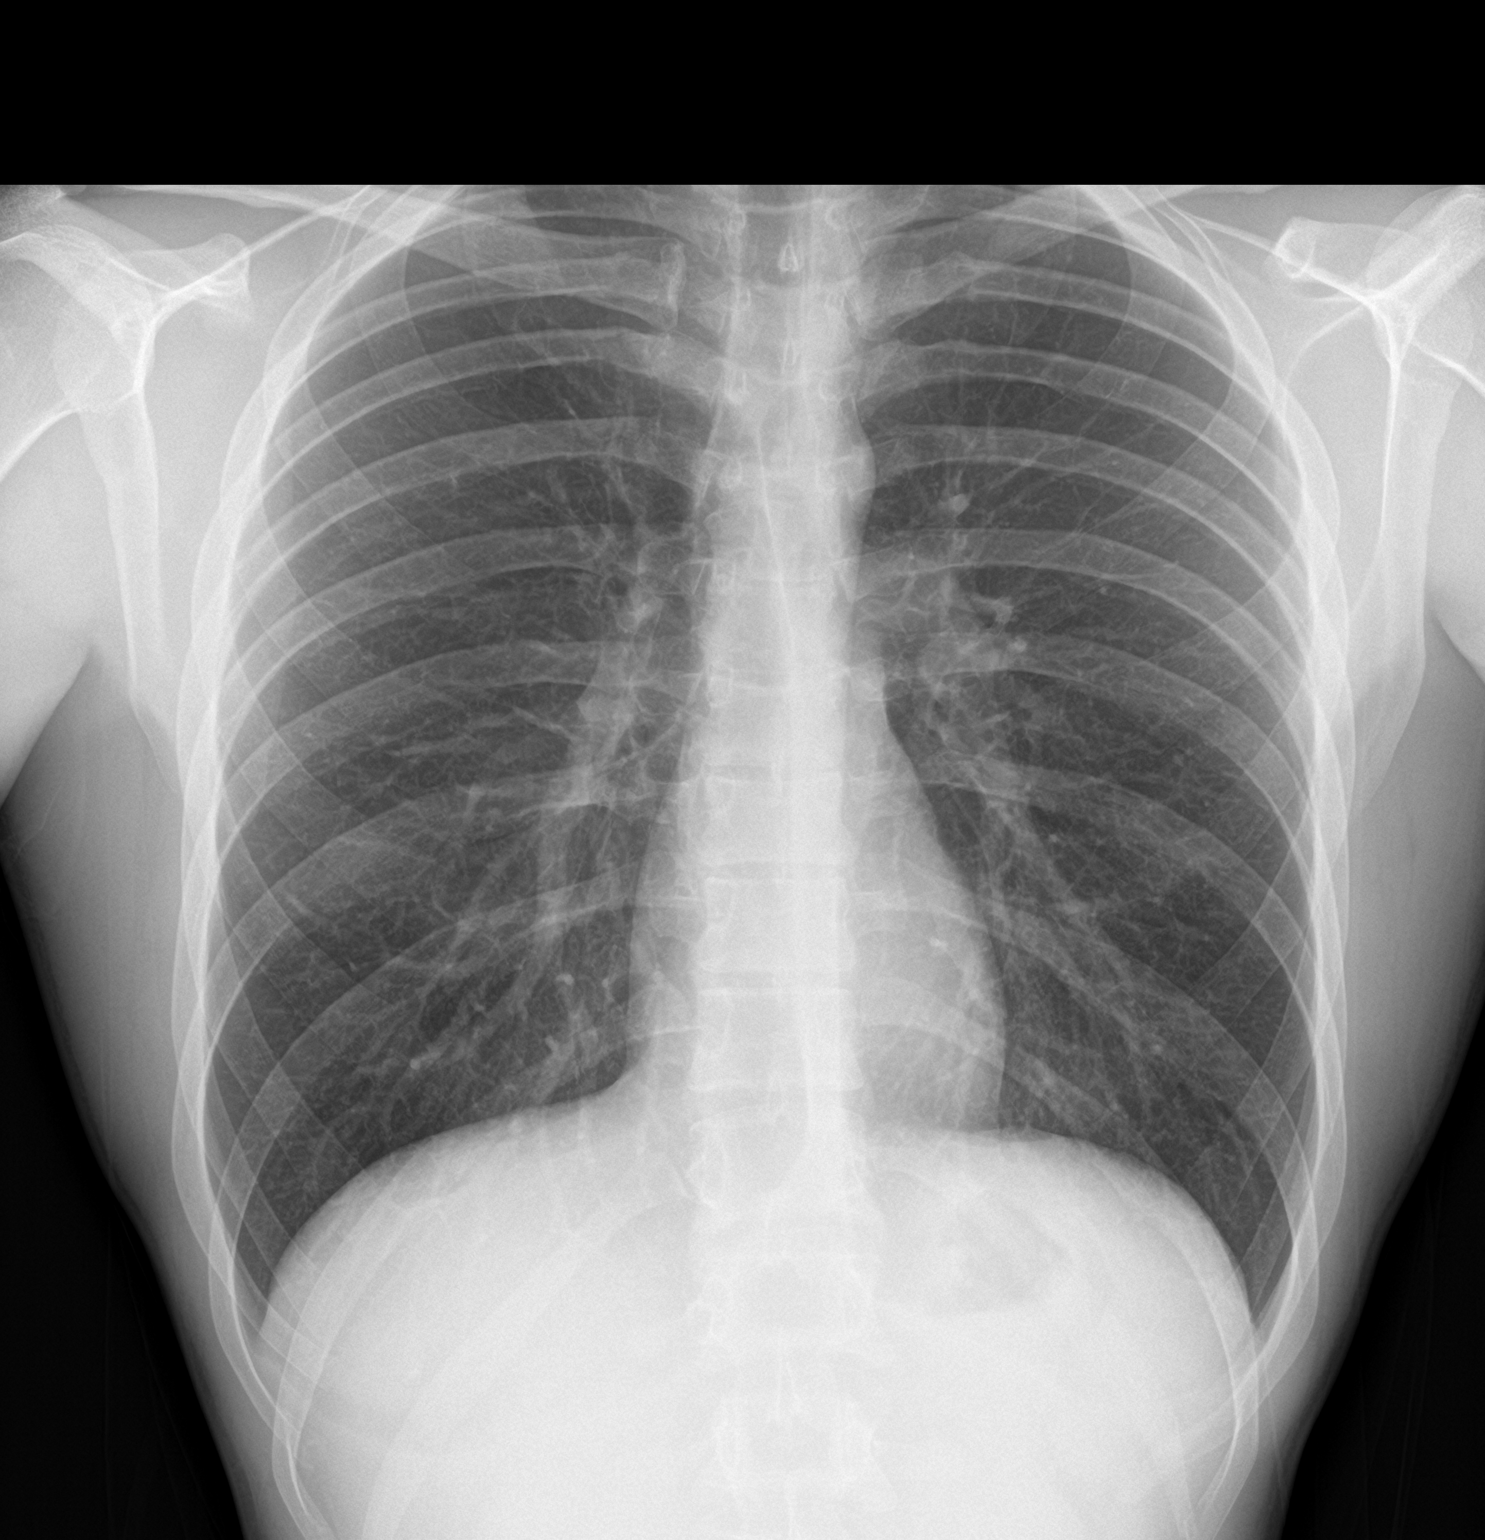

[chest lat]
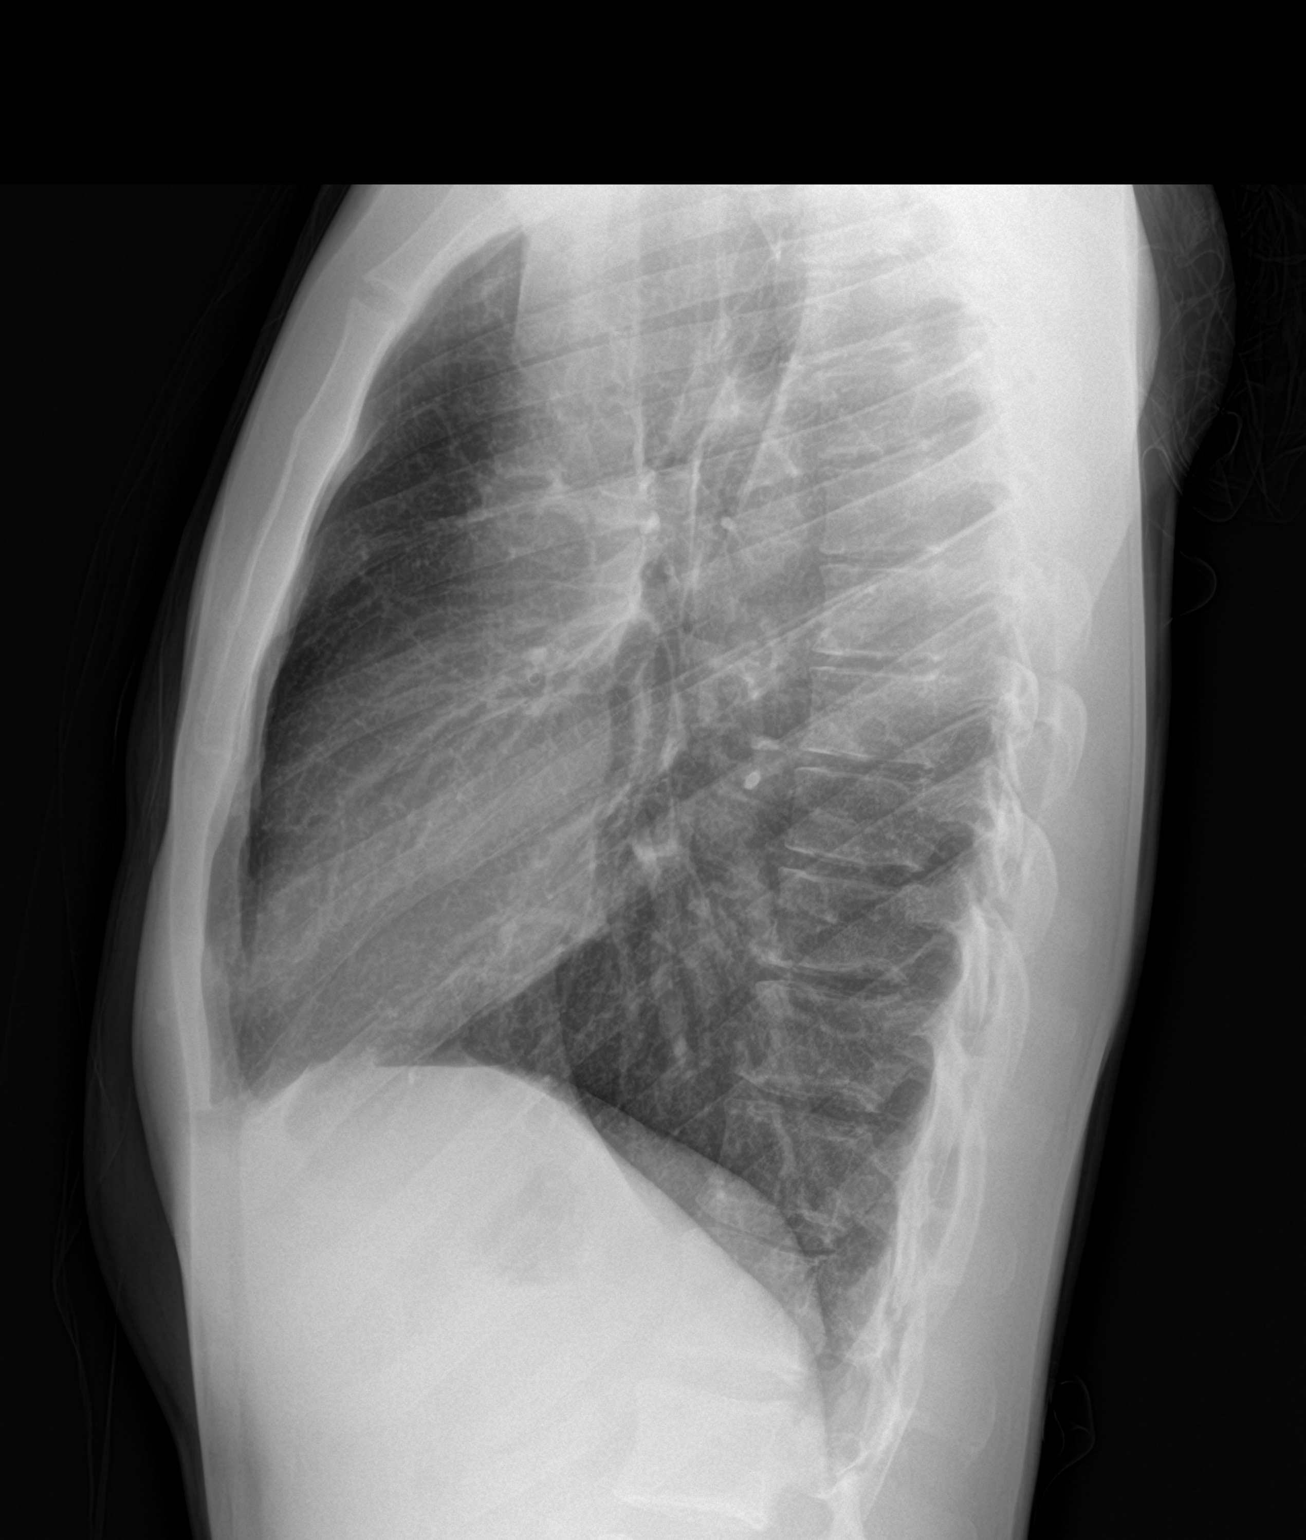

[2 of 2 positions shown; findings below may reference images not displayed]

FINDINGS: Lung volumes are normal. No consolidative airspace disease. No
pleural effusions. No pneumothorax. No pulmonary nodule or mass
noted. Pulmonary vasculature and the cardiomediastinal silhouette
are within normal limits.
IMPRESSION: No radiographic evidence of acute cardiopulmonary disease.

## 2022-08-23 ENCOUNTER — Ambulatory Visit (HOSPITAL_COMMUNITY): Admission: RE | Admit: 2022-08-23 | Payer: Medicaid Other | Source: Ambulatory Visit

## 2022-08-23 ENCOUNTER — Encounter (HOSPITAL_COMMUNITY): Payer: Self-pay

## 2022-08-23 ENCOUNTER — Ambulatory Visit (HOSPITAL_COMMUNITY)
Admission: RE | Admit: 2022-08-23 | Discharge: 2022-08-23 | Disposition: A | Discharge status: Home / Self Care | Payer: Medicaid Other | Source: Ambulatory Visit | Attending: Urology | Admitting: Urology

## 2022-08-23 ENCOUNTER — Other Ambulatory Visit (HOSPITAL_COMMUNITY): Payer: Self-pay | Admitting: Urology

## 2022-08-23 DIAGNOSIS — Z8547 Personal history of malignant neoplasm of testis: Secondary | ICD-10-CM | POA: Insufficient documentation

## 2022-08-25 DIAGNOSIS — Z8547 Personal history of malignant neoplasm of testis: Secondary | ICD-10-CM | POA: Diagnosis not present

## 2022-08-30 DIAGNOSIS — C6212 Malignant neoplasm of descended left testis: Secondary | ICD-10-CM | POA: Diagnosis not present

## 2022-12-29 ENCOUNTER — Ambulatory Visit (HOSPITAL_COMMUNITY): Admission: RE | Admit: 2022-12-29 | Payer: Medicaid Other | Source: Ambulatory Visit

## 2022-12-29 ENCOUNTER — Other Ambulatory Visit (HOSPITAL_COMMUNITY): Payer: Self-pay | Admitting: Urology

## 2022-12-29 ENCOUNTER — Ambulatory Visit (HOSPITAL_COMMUNITY)
Admission: RE | Admit: 2022-12-29 | Discharge: 2022-12-29 | Disposition: A | Discharge status: Home / Self Care | Payer: Medicaid Other | Source: Ambulatory Visit | Attending: Urology | Admitting: Urology

## 2022-12-29 ENCOUNTER — Encounter (HOSPITAL_COMMUNITY): Payer: Self-pay

## 2022-12-29 DIAGNOSIS — Z8547 Personal history of malignant neoplasm of testis: Secondary | ICD-10-CM

## 2022-12-29 DIAGNOSIS — C6290 Malignant neoplasm of unspecified testis, unspecified whether descended or undescended: Secondary | ICD-10-CM | POA: Diagnosis not present

## 2023-01-05 DIAGNOSIS — Z8547 Personal history of malignant neoplasm of testis: Secondary | ICD-10-CM | POA: Diagnosis not present

## 2023-01-25 ENCOUNTER — Ambulatory Visit: Payer: Medicaid Other | Admitting: Cardiovascular Disease

## 2023-06-27 DIAGNOSIS — R0981 Nasal congestion: Secondary | ICD-10-CM | POA: Diagnosis not present

## 2023-06-27 DIAGNOSIS — R051 Acute cough: Secondary | ICD-10-CM | POA: Diagnosis not present

## 2023-06-27 DIAGNOSIS — H1033 Unspecified acute conjunctivitis, bilateral: Secondary | ICD-10-CM | POA: Diagnosis not present

## 2023-06-27 DIAGNOSIS — S0500XA Injury of conjunctiva and corneal abrasion without foreign body, unspecified eye, initial encounter: Secondary | ICD-10-CM | POA: Diagnosis not present

## 2024-07-02 ENCOUNTER — Other Ambulatory Visit (HOSPITAL_COMMUNITY): Payer: Self-pay | Admitting: Urology

## 2024-07-02 ENCOUNTER — Ambulatory Visit (HOSPITAL_COMMUNITY)
Admission: RE | Admit: 2024-07-02 | Discharge: 2024-07-02 | Disposition: A | Source: Ambulatory Visit | Attending: Urology | Admitting: Urology

## 2024-07-02 DIAGNOSIS — Z8547 Personal history of malignant neoplasm of testis: Secondary | ICD-10-CM
